# Patient Record
Sex: Male | Born: 2000 | Race: Black or African American | Hispanic: No | Marital: Single | Smoking: Never smoker
Health system: Southern US, Community
[De-identification: ages and names within clinical notes are randomized; demographics above are authoritative.]

## PROBLEM LIST (undated history)

## (undated) DIAGNOSIS — F909 Attention-deficit hyperactivity disorder, unspecified type: Secondary | ICD-10-CM

## (undated) HISTORY — DX: Attention-deficit hyperactivity disorder, unspecified type: F90.9

---
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
## 2019-01-11 ENCOUNTER — Ambulatory Visit (INDEPENDENT_AMBULATORY_CARE_PROVIDER_SITE_OTHER): Payer: Self-pay | Admitting: Family Medicine

## 2019-01-11 ENCOUNTER — Encounter: Payer: Self-pay | Admitting: Family Medicine

## 2019-01-11 ENCOUNTER — Other Ambulatory Visit: Payer: Self-pay

## 2019-01-11 DIAGNOSIS — Z0000 Encounter for general adult medical examination without abnormal findings: Secondary | ICD-10-CM

## 2019-01-11 DIAGNOSIS — F39 Unspecified mood [affective] disorder: Secondary | ICD-10-CM

## 2019-01-13 DIAGNOSIS — Z0000 Encounter for general adult medical examination without abnormal findings: Secondary | ICD-10-CM | POA: Insufficient documentation

## 2019-01-13 NOTE — Assessment & Plan Note (Signed)
Patient here to establish care and to get note saying that he is likely okay to play football.  No concerning features on physical, history, or chart review.  Patient did not bring a form with him so I have written a letter saying that I do not find any obvious reason for him to not be able play football.

## 2019-01-13 NOTE — Progress Notes (Signed)
    Subjective:  Bruce Watson is a 18 y.o. male who presents to the [facility] today with a chief complaint of establish care and football physical.   HPI: Patient with 0 physical complaints, comes in to establish care and to get a note to be able to play football.  He plays for a [employer].  He says that he has no prior history of surgeries, no significant injuries or hospitalizations, no current medications, no known family history of sudden cardiac death, does not use alcohol or illicit drugs.  Denies high risk sexual activity.  Review of Systems  Constitutional: Negative.   HENT: Negative.   Eyes: Negative.   Respiratory: Negative.   Cardiovascular: Negative.   Gastrointestinal: Negative.   Genitourinary: Negative.   Musculoskeletal: Negative.   Neurological: Negative.   Psychiatric/Behavioral: Negative.       Objective:  Physical Exam: BP 100/68   Pulse 65   Wt 182 lb 6.4 oz (82.7 kg)   SpO2 100%   Gen: NAD, athletic appearing  CV: RRR with no murmurs appreciated Pulm: NWOB, CTAB with no crackles, wheezes, or rhonchi GI: Normal bowel sounds present. Soft, Nontender, Nondistended. MSK: no edema, cyanosis, or clubbing noted, good flexibility without any sign of physical inhibition. Skin: warm, dry Neuro: grossly normal, moves all extremities Psych: Normal affect and thought content  No results found for this or any previous visit (from the past 72 hour(s)).   Assessment/Plan:  Encounter for medical examination to establish care Patient here to establish care and to get note saying that he is likely okay to play football.  No concerning features on physical, history, or chart review.  Patient did not bring a form with him so I have written a letter saying that I do not find any obvious reason for him to not be able play football.   Sherene Sires, DO FAMILY MEDICINE RESIDENT - PGY3 01/13/2019 6:37 AM

## 2020-10-16 ENCOUNTER — Ambulatory Visit (INDEPENDENT_AMBULATORY_CARE_PROVIDER_SITE_OTHER): Payer: Medicaid Other | Admitting: Family Medicine

## 2020-10-16 ENCOUNTER — Ambulatory Visit (INDEPENDENT_AMBULATORY_CARE_PROVIDER_SITE_OTHER): Payer: Medicaid Other

## 2020-10-16 ENCOUNTER — Other Ambulatory Visit: Payer: Self-pay

## 2020-10-16 ENCOUNTER — Encounter: Payer: Self-pay | Admitting: Family Medicine

## 2020-10-16 VITALS — BP 137/77 | HR 80 | Ht 69.0 in | Wt 209.6 lb

## 2020-10-16 DIAGNOSIS — Z0000 Encounter for general adult medical examination without abnormal findings: Secondary | ICD-10-CM

## 2020-10-16 DIAGNOSIS — Z202 Contact with and (suspected) exposure to infections with a predominantly sexual mode of transmission: Secondary | ICD-10-CM

## 2020-10-16 DIAGNOSIS — Z23 Encounter for immunization: Secondary | ICD-10-CM

## 2020-10-16 MED ORDER — AZITHROMYCIN 500 MG PO TABS
1000.0000 mg | ORAL_TABLET | Freq: Once | ORAL | Status: AC
  Administered 2020-10-16: 1000 mg via ORAL

## 2020-10-16 NOTE — Assessment & Plan Note (Signed)
Patient in need of COVID vaccination as well as Tdap.  Both given to patient today.  Patient will follow up in 21 days for second dose of COVID-19 vaccine.

## 2020-10-16 NOTE — Progress Notes (Signed)
    SUBJECTIVE:   CHIEF COMPLAINT / HPI:   Checkup Patient reports he is here for his routine annual checkup.  He has no major concerns at this time.  He is in need of his COVID-19 vaccine as well as a tetanus vaccine.  Chlamydia exposure Patient reports that his girlfriend told him last week that she tested positive for chlamydia.  She was treated but got no partner therapy.  He denies any symptoms such as increased urination, penile discharge, pain with urination.  He has not been sexually active with her since she tested positive for chlamydia but was sexually active with her before. OBJECTIVE:   BP 137/77   Pulse 80   Ht 5\' 9"  (1.753 m)   Wt 209 lb 9.6 oz (95.1 kg)   SpO2 99%   BMI 30.95 kg/m   General: Well-appearing 20 year old male, no acute distress HEENT: Patient with moist mucous membranes, nasal turbinates nonerythematous Cardiac: Regular rate and rhythm, no murmurs appreciated Respiratory: Normal work of breathing, lungs clear to auscultation bilaterally Abdomen: Soft, nontender, positive bowel sounds Extremities: No gross abnormalities  ASSESSMENT/PLAN:   Chlamydia contact, untreated Patient with exposure to chlamydia.  Has not been treated.  Is asymptomatic at this time.  We will give 1 g of azithromycin and strict return precautions given.  Patient is agreeable to this.  Healthcare maintenance Patient in need of COVID vaccination as well as Tdap.  Both given to patient today.  Patient will follow up in 21 days for second dose of COVID-19 vaccine.     Gifford Shave, MD [place]

## 2020-10-16 NOTE — Patient Instructions (Signed)
It was great seeing you today.  For your chlamydia exposure we have given you a medication to treat this.  It is a one-time dose of azithromycin.  You also need your tetanus vaccine as well as your COVID-19 vaccine which we gave you both of those today.  I have no other concerns at this time.  If you have any questions or worries please feel free to call the clinic.  I hope you have a wonderful afternoon!

## 2020-10-16 NOTE — Assessment & Plan Note (Signed)
Patient with exposure to chlamydia.  Has not been treated.  Is asymptomatic at this time.  We will give 1 g of azithromycin and strict return precautions given.  Patient is agreeable to this.

## 2020-10-24 ENCOUNTER — Telehealth: Payer: Self-pay | Admitting: Family Medicine

## 2020-10-24 NOTE — Telephone Encounter (Signed)
Attempted to call patient regarding further testing but he did not answer.  Sent him a MyChart message regarding if he would like blood tests for STIs or not.

## 2020-11-11 ENCOUNTER — Ambulatory Visit (INDEPENDENT_AMBULATORY_CARE_PROVIDER_SITE_OTHER): Payer: Self-pay

## 2020-11-11 ENCOUNTER — Other Ambulatory Visit: Payer: Self-pay

## 2020-11-11 DIAGNOSIS — Z23 Encounter for immunization: Secondary | ICD-10-CM

## 2020-11-27 ENCOUNTER — Ambulatory Visit (INDEPENDENT_AMBULATORY_CARE_PROVIDER_SITE_OTHER): Payer: Self-pay | Admitting: Family Medicine

## 2020-11-27 ENCOUNTER — Encounter: Payer: Self-pay | Admitting: Family Medicine

## 2020-11-27 ENCOUNTER — Other Ambulatory Visit: Payer: Self-pay

## 2020-11-27 VITALS — BP 126/68 | HR 70 | Ht 69.0 in | Wt 204.0 lb

## 2020-11-27 DIAGNOSIS — D2230 Melanocytic nevi of unspecified part of face: Secondary | ICD-10-CM

## 2020-11-27 NOTE — Patient Instructions (Signed)
It was great seeing you today.  I have placed a referral to dermatology for the mole on your face.  Someone will be calling you to schedule an appointment.  If you have any questions or concerns please let me know.  I hope you have a wonderful afternoon!

## 2020-11-27 NOTE — Progress Notes (Signed)
    SUBJECTIVE:   CHIEF COMPLAINT / HPI:   Mole on face Patient reports he has had a mole on his face for many years now and it is really bothering him.  It is right above his lip and he would like assessment for removal.  Denies any recent changes in size but reports that over the years it has gotten bigger.  He has not seen a dermatologist for this.   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  No recent changes appreciated.  Would like evaluation by dermatology.  Dermatology referral placed.     Gifford Shave, MD [place]

## 2020-11-28 DIAGNOSIS — D2230 Melanocytic nevi of unspecified part of face: Secondary | ICD-10-CM | POA: Insufficient documentation

## 2020-11-28 NOTE — Assessment & Plan Note (Signed)
Patient with stable melanocytic nevi on right side of face above lip.  No recent changes appreciated.  Would like evaluation by dermatology.  Dermatology referral placed.

## 2020-12-03 ENCOUNTER — Telehealth: Payer: Self-pay | Admitting: Dermatology

## 2020-12-03 NOTE — Telephone Encounter (Signed)
Notes documented and referral routed back to referring office. 

## 2020-12-03 NOTE — Telephone Encounter (Signed)
Referral doesn't want to wait until February; please request referrer try to get him in elsewhere sooner

## 2020-12-13 ENCOUNTER — Other Ambulatory Visit: Payer: Self-pay | Admitting: Family Medicine

## 2020-12-13 ENCOUNTER — Encounter: Payer: Self-pay | Admitting: Family Medicine

## 2020-12-13 DIAGNOSIS — D2230 Melanocytic nevi of unspecified part of face: Secondary | ICD-10-CM

## 2020-12-30 NOTE — Progress Notes (Signed)
    SUBJECTIVE:   CHIEF COMPLAINT / HPI:   Mr. Fiqueroa is a 20 yo M who presents for the reasons below.  STD screening No concerns today.  Follow-up for complete STD screening.  Was exposed to chlamydia in June in which he was treated but not tested.  Denies dysuria, penile sores/discharge.  Endorses condom use during sexual activity.  Elevated blood pressure States he has been eating salty fries. He has had some elevated blood pressure readings in the past. Denies headache, vision changes, chest pain, shortness of breath.   PERTINENT  PMH / PSH: Mood disorder  OBJECTIVE:   BP 136/80   Pulse 96   Wt 208 lb 6.4 oz (94.5 kg)   SpO2 100%   BMI 30.78 kg/m   Physical Exam Vitals reviewed.  Cardiovascular:     Rate and Rhythm: Normal rate and regular rhythm.     Heart sounds: Normal heart sounds.  Pulmonary:     Effort: Pulmonary effort is normal.  Abdominal:     General: Bowel sounds are normal.     Tenderness: There is no abdominal tenderness.  Genitourinary:    Comments: Exam deferred.  Neurological:     Mental Status: He is alert.   ASSESSMENT/PLAN:   1. Screening examination for sexually transmitted disease - HIV antibody (with reflex) - RPR - Urine cytology ancillary only  2. Screening for viral disease - Hepatitis C antibody (reflex, frozen specimen)  3. Elevated blood pressure reading Asymptomatic. Discussed decreasing salt intake and follow up with PCP for subsequent monitoring.     Gerlene Fee, [place]

## 2021-01-01 ENCOUNTER — Other Ambulatory Visit (HOSPITAL_COMMUNITY)
Admission: RE | Admit: 2021-01-01 | Discharge: 2021-01-01 | Disposition: A | Discharge status: Home / Self Care | Payer: Medicaid Other | Source: Ambulatory Visit | Attending: Family Medicine | Admitting: Family Medicine

## 2021-01-01 ENCOUNTER — Ambulatory Visit (INDEPENDENT_AMBULATORY_CARE_PROVIDER_SITE_OTHER): Payer: Medicaid Other | Admitting: Family Medicine

## 2021-01-01 ENCOUNTER — Other Ambulatory Visit: Payer: Self-pay

## 2021-01-01 VITALS — BP 136/80 | HR 96 | Wt 208.4 lb

## 2021-01-01 DIAGNOSIS — Z113 Encounter for screening for infections with a predominantly sexual mode of transmission: Secondary | ICD-10-CM | POA: Diagnosis present

## 2021-01-01 DIAGNOSIS — Z1159 Encounter for screening for other viral diseases: Secondary | ICD-10-CM

## 2021-01-01 DIAGNOSIS — R030 Elevated blood-pressure reading, without diagnosis of hypertension: Secondary | ICD-10-CM

## 2021-01-01 NOTE — Patient Instructions (Signed)
Thank you for coming in today.  I will notify you via MyChart or phone when results available.  Please follow-up with your primary care physician for your elevated blood pressure.  Dr. Janus Molder

## 2021-01-02 LAB — RPR: RPR Ser Ql: NONREACTIVE

## 2021-01-02 LAB — URINE CYTOLOGY ANCILLARY ONLY
Chlamydia: NEGATIVE
Comment: NEGATIVE
Comment: NEGATIVE
Comment: NORMAL
Neisseria Gonorrhea: NEGATIVE
Trichomonas: NEGATIVE

## 2021-01-02 LAB — HCV AB W REFLEX TO QUANT PCR: HCV Ab: <0.1 s/co ratio (ref 0.0–0.9)

## 2021-01-02 LAB — HCV INTERPRETATION

## 2021-01-02 LAB — HIV ANTIBODY (ROUTINE TESTING W REFLEX): HIV Screen 4th Generation wRfx: NONREACTIVE

## 2021-01-03 ENCOUNTER — Ambulatory Visit: Payer: Medicaid Other | Admitting: Family Medicine

## 2021-04-18 ENCOUNTER — Ambulatory Visit (INDEPENDENT_AMBULATORY_CARE_PROVIDER_SITE_OTHER): Payer: Self-pay | Admitting: Plastic Surgery

## 2021-04-18 ENCOUNTER — Encounter: Payer: Self-pay | Admitting: Plastic Surgery

## 2021-04-18 ENCOUNTER — Other Ambulatory Visit: Payer: Self-pay

## 2021-04-18 VITALS — BP 140/93 | HR 85 | Ht 68.0 in | Wt 207.0 lb

## 2021-04-18 DIAGNOSIS — F39 Unspecified mood [affective] disorder: Secondary | ICD-10-CM

## 2021-04-18 DIAGNOSIS — D489 Neoplasm of uncertain behavior, unspecified: Secondary | ICD-10-CM

## 2021-04-18 NOTE — Progress Notes (Addendum)
   Referring Provider Gifford Shave, MD [address],  [place] [address]   CC:  Complains of neoplasm right nasolabial fold   Bruce Watson is an 20 y.o. male.  HPI: The patient is a 20 year old male with a history of neoplasm of the right nasolabial fold.  He states that this has been present since elementary school and is very interested in excision.  He has never had a biopsy.  Dermatology is seen and has suggested removal.  No Known Allergies  No outpatient encounter medications on file as of 04/18/2021.   No facility-administered encounter medications on file as of 04/18/2021.     Past Medical History:  Diagnosis Date   ADHD (attention deficit hyperactivity disorder)     No past surgical history on file.  Family History  Problem Relation Age of Onset   Diabetes Neg Hx    Hypertension Neg Hx     Social History   Social History Narrative   Lives with mom dad and siblings.  Is in 7th grade.     Review of Systems General: Denies fevers, chills, weight loss CV: Denies chest pain, shortness of breath, palpitations   Physical Exam Vitals with BMI 04/18/2021 01/01/2021 01/01/2021  Height 5\' 8"  - -  Weight 207 lbs - 208 lbs 6 oz  BMI 17.79 - -  Systolic 390 300 923  Diastolic 93 80 95  Pulse 85 - 96    General:  No acute distress,  Alert and oriented, Non-Toxic, Normal speech and affect HEENT: 1.4 x 1.8 cm irregular pigmented lesion right nasolabial fold.  Assessment/Plan Excision of right nasolabial fold pigmented lesion indicated.   Bruce Watson 04/18/2021, 10:46 AM

## 2021-07-24 ENCOUNTER — Encounter: Payer: Self-pay | Admitting: Family Medicine

## 2021-09-07 ENCOUNTER — Encounter: Payer: Self-pay | Admitting: Plastic Surgery

## 2021-10-07 ENCOUNTER — Encounter: Payer: Self-pay | Admitting: *Deleted

## 2021-10-22 ENCOUNTER — Encounter: Payer: Medicaid Other | Admitting: Family Medicine

## 2021-10-23 ENCOUNTER — Encounter: Payer: Medicaid Other | Admitting: Family Medicine

## 2021-10-31 ENCOUNTER — Institutional Professional Consult (permissible substitution): Payer: Medicaid Other | Admitting: Plastic Surgery

## 2021-11-17 ENCOUNTER — Encounter: Payer: Self-pay | Admitting: Family Medicine

## 2022-04-01 NOTE — Progress Notes (Unsigned)
    SUBJECTIVE:   Chief compliant/HPI: annual examination  Bruce Watson is a 21 y.o. who presents today for an annual exam. No concerns today.  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   Varied diet, getting good exercise regularly at the gym.  Sexually active most recently a few months ago. Interested in STD testing today. Denies GU symptoms including abnormal discharge, dysuria, hematuria  Denies recent headaches, n/v, fevers/sickness, SOB, CP  Denies family hx of medical conditions  Sees dentist  OBJECTIVE:   BP (!) 137/98   Pulse 76   Ht '5\' 8"'$  (1.727 m)   Wt 226 lb (102.5 kg)   SpO2 100%   BMI 34.36 kg/m    General: NAD, pleasant, able to participate in exam Cardiac: RRR, no murmurs auscultated Respiratory: CTAB, normal WOB Abdomen: soft, non-tender, non-distended, normoactive bowel sounds Extremities: warm and well perfused, no edema or cyanosis. 5/5 strength b/l upper and lower extremities Skin: warm and dry, no rashes noted Neuro: alert, no obvious focal deficits, speech normal. 2+ patellar reflexes b/l Psych: Normal affect and mood   ASSESSMENT/PLAN:   Routine screening for STI (sexually transmitted infection) -     HIV Antibody (routine testing w rflx) -     RPR -     Urine cytology ancillary only    Annual Examination  See AVS for age appropriate recommendations  PHQ score 0, reviewed and discussed.  Blood pressure reviewed and elevated but asymptomatic. Suspect [facility], will continue to follow at future visits  Considered the following items based upon USPSTF recommendations: HIV testing: ordered Syphilis if at high risk: requested GC/CTordered Immunizations flu shot provided today   Follow up in 1 year or sooner if indicated.    August Albino, MD [place]

## 2022-04-02 ENCOUNTER — Encounter: Payer: Self-pay | Admitting: Family Medicine

## 2022-04-02 ENCOUNTER — Ambulatory Visit (INDEPENDENT_AMBULATORY_CARE_PROVIDER_SITE_OTHER): Payer: BLUE CROSS/BLUE SHIELD | Admitting: Family Medicine

## 2022-04-02 VITALS — BP 137/98 | HR 76 | Ht 68.0 in | Wt 226.0 lb

## 2022-04-02 DIAGNOSIS — Z113 Encounter for screening for infections with a predominantly sexual mode of transmission: Secondary | ICD-10-CM

## 2022-04-02 DIAGNOSIS — Z23 Encounter for immunization: Secondary | ICD-10-CM | POA: Diagnosis not present

## 2022-04-02 NOTE — Patient Instructions (Addendum)
It was wonderful to see you today.  Please bring ALL of your medications with you to every visit.   Updates from today's visit:  Everything looks good today! Your blood pressure was a little elevated but I am reassured that you have not had any symptoms. Please continue to stay active and eat healthy. Please call our office if you have any concerns.  I will send you a mychart message or give you a call if your results are abnormal today.  Please follow up in 1 year or sooner as needed  Thank you for choosing [facility].   Please call [phone] with any questions about today's appointment.  Please be sure to schedule follow up at the front  desk before you leave today.   August Albino, MD  Family Medicine

## 2022-04-03 LAB — URINE CYTOLOGY ANCILLARY ONLY
Chlamydia: NEGATIVE
Comment: NEGATIVE
Comment: NORMAL
Neisseria Gonorrhea: NEGATIVE

## 2022-04-03 LAB — HIV ANTIBODY (ROUTINE TESTING W REFLEX): HIV Screen 4th Generation wRfx: NONREACTIVE

## 2022-04-03 LAB — RPR: RPR Ser Ql: NONREACTIVE

## 2022-09-14 ENCOUNTER — Ambulatory Visit: Payer: BLUE CROSS/BLUE SHIELD | Admitting: Family Medicine

## 2022-09-16 ENCOUNTER — Ambulatory Visit (INDEPENDENT_AMBULATORY_CARE_PROVIDER_SITE_OTHER): Payer: BLUE CROSS/BLUE SHIELD | Admitting: Student

## 2022-09-16 ENCOUNTER — Other Ambulatory Visit (HOSPITAL_COMMUNITY)
Admission: RE | Admit: 2022-09-16 | Discharge: 2022-09-16 | Disposition: A | Discharge status: Home / Self Care | Payer: BLUE CROSS/BLUE SHIELD | Source: Ambulatory Visit | Attending: Family Medicine | Admitting: Family Medicine

## 2022-09-16 ENCOUNTER — Encounter: Payer: Self-pay | Admitting: Student

## 2022-09-16 VITALS — BP 124/70 | HR 68 | Ht 68.0 in | Wt 223.0 lb

## 2022-09-16 DIAGNOSIS — Z113 Encounter for screening for infections with a predominantly sexual mode of transmission: Secondary | ICD-10-CM | POA: Diagnosis present

## 2022-09-16 NOTE — Progress Notes (Cosign Needed Addendum)
    SUBJECTIVE:   CHIEF COMPLAINT / HPI:   22 y.o.  year old male presents  for routine STD testing. He is sexually active with multiple partner,  inconsistent with protection and indulges in oral sex. Denies any dysuria, penile discharge or lesions   PERTINENT  PMH / PSH: Reviewed  OBJECTIVE:   BP 124/70   Pulse 68   Ht 5\' 8"  (1.727 m)   Wt 101.2 kg   SpO2 98%   BMI 33.91 kg/m    Physical Exam General: Alert, well appearing, NAD HEENT: no visible oral lesions, MMM Cardiovascular: RRR, well-perfused Respiratory: Normal work of breathing on room air Abdomen: No distension or tenderness GU: Deferred per patient   ASSESSMENT/PLAN:   STD screening Sexually active male who is currently asymptomatic is here for routine testing. -Ordered labs to test for chlamydia, gonorrhea, trichomoniasis, HIV and RPR- -Discussed safe sex practices -Advised routine oral exams for lesion  Addendum: Called and verified patient. Informed patient his test results were negative and discussed need for oral swab which was not collected at his visit. Patient declined a swab at this time with plan to  check at his next STD screening.  Counseling and complications discussed.    Jerre Simon, MD [facility]

## 2022-09-16 NOTE — Patient Instructions (Signed)
It was wonderful to meet you today. Thank you for allowing me to be a part of your care. Below is a short summary of what we discussed at your visit today:  Today we have collected lab for STD testing including chlamydia, gonorrhea, trichomonas, HIV and syphilis.  If you have MyChart you should be able to see the results on MyChart.    Will call you if any of your results came back positive   Please bring all of your medications to every appointment!  If you have any questions or concerns, please do not hesitate to contact us via phone or MyChart message.   Jerre Simon, MD [facility]

## 2022-09-17 LAB — URINE CYTOLOGY ANCILLARY ONLY
Chlamydia: NEGATIVE
Comment: NEGATIVE
Comment: NEGATIVE
Comment: NORMAL
Neisseria Gonorrhea: NEGATIVE
Trichomonas: NEGATIVE

## 2022-09-17 LAB — RPR: RPR Ser Ql: NONREACTIVE

## 2022-09-17 LAB — HIV ANTIBODY (ROUTINE TESTING W REFLEX): HIV Screen 4th Generation wRfx: NONREACTIVE

## 2022-10-19 ENCOUNTER — Encounter: Payer: Self-pay | Admitting: Family Medicine

## 2022-10-23 ENCOUNTER — Encounter: Payer: Self-pay | Admitting: Family Medicine

## 2022-10-23 ENCOUNTER — Ambulatory Visit (INDEPENDENT_AMBULATORY_CARE_PROVIDER_SITE_OTHER): Payer: BLUE CROSS/BLUE SHIELD | Admitting: Family Medicine

## 2022-10-23 VITALS — BP 126/60 | HR 69 | Wt 225.2 lb

## 2022-10-23 DIAGNOSIS — Z0000 Encounter for general adult medical examination without abnormal findings: Secondary | ICD-10-CM | POA: Diagnosis not present

## 2022-10-23 DIAGNOSIS — Z025 Encounter for examination for participation in sport: Secondary | ICD-10-CM

## 2022-10-23 NOTE — Patient Instructions (Addendum)
It was great to see you!  I have completed the form clearing you for sports participation.  General recommendations we have to keep you healthy: - Exercise at least 30-45 minutes a day, 3-4 days a week.  - Eat a diet with lots of fruits and vegetables (5 servings per day). - Avoid sugar sweetened beverages and processed foods whenever possible. - Seatbelts can save your life. Wear them always.  - Safe sex - use a condom to prevent STIs. - Alcohol -  If you drink, do it moderately, less than 2 drinks per day. - Sleep - aim for 7-8 hours nightly. - Limit screen time to no more than 2 hours per day.  - Health Care Power of Attorney. Choose someone to make decisions for you if you are not able.  - Depression is common in our stressful world. If you're feeling down or losing interest in things you normally enjoy, please come in for a visit.  - Violence - If anyone is threatening or hurting you, please call immediately.  - See a dentist on a regular basis  Follow up in 1 year for annual physical or sooner if you have any concerns  Take care, Dr Anner Crete

## 2022-10-23 NOTE — Progress Notes (Signed)
Family Medicine Sports Pre-Participation Visit 10/23/22  Bruce Watson 22 y.o. male Sport: football at [employer]  HPI:  Bruce Watson is a 22 y.o. male presenting for a pre-participation examination. No other concerns today.  Past medical history: None  No history of chest pain, shortness of breath, irregular heart beats, exercise intolerance, headaches with exercise, syncope, or seizures  No history of head/neck injury or concussion No history of blood disorders No history of Mononucleosis No history of skin infections or sores Reviewed all of the above and all were negative.   Family History: - No family history of significant cardiac or pulmonary conditions. No history of sudden collapse or sudden death  Past Surgical history / Overnight Hospital Stays: none  History of Organ Removal or Absence of organ(s) at birth:  none Past Sports Participation History & Injury History:  no problems Medications: none  Medication allergies: none  Nutritional concerns: none Mental health concerns: none  Use of glasses / contacts?:  no Immunizations up to date?: yes  ROS:  Constitutional: Negative. No unexpected weight changes.  Respiratory: Negative. No shortness of breath or chest pain Cardiovascular: Negative. No palpitations,               Gastrointestinal: Negative. No abdominal pain, nausea, or vomiting.  Genitourinary: Negative.  Musculoskeletal: No back pain, hip pain, joint pain, muscle pain, gait changes, or recent falls.  Skin: Negative. No erythema or swelling. No rashes or lesions noted.  Endocrine: No heat/cold intolerance. No diaphoresis.  Neurological: Negative. No weakness/instability, numbness/tingling in the extremities. No gait abnormalities  Physical Exam:  Vitals:   10/23/22 1540  BP: 126/60  Pulse: 69  SpO2: 100%    Vision Screening   Right eye Left eye Both eyes  Without correction 20/20 20/20 20/20   With correction      Well  appearing, no acute distress Vital signs reviewed including BP, BMI, vision status HEENT: PERRLA, EOMI, conjunctivae pale; Oropharynx clear without significant tonsillar enlargement. Tympanic membranes visualized bilaterally and no retraction/bulging or effusion noted.  Neck: supple, no cervical adenopathy CV: normal S1, S1, RRR.  No murmurs, gallops, or rubs. Lungs: clear to auscultation bilaterally. Abd: soft, non-tender, no mass, no guarding, no rebound.  Extremities: no edema MSK: No significant findings on examination of the knees, hips, shoulders,  hands/wrists/elbows, or feet/ankles. No significant neck or lumbar spine findings. Neuro: alert and fully oriented, CN II-XII intact, no motor or sensory deficits. No gait abnormalities   Assessment and Plan  Bruce Watson is a 22 y.o. male presenting for a pre-participation examination. The patient is cleared to play the above sport. School form completed, given to patient. Reviewed reasons to return to care.    Maury Dus, MD PGY-3, [facility]

## 2023-04-03 ENCOUNTER — Other Ambulatory Visit (HOSPITAL_COMMUNITY): Admit: 2023-04-03 | Payer: BLUE CROSS/BLUE SHIELD
